# Patient Record
Sex: Male | Born: 1985 | Race: Black or African American | Hispanic: No | Marital: Single | Smoking: Current every day smoker
Health system: Southern US, Community
[De-identification: ages and names within clinical notes are randomized; demographics above are authoritative.]

---
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
## 2007-08-01 ENCOUNTER — Emergency Department: Payer: Self-pay | Admitting: Emergency Medicine

## 2008-07-21 ENCOUNTER — Emergency Department (HOSPITAL_COMMUNITY): Admission: EM | Admit: 2008-07-21 | Discharge: 2008-07-21 | Payer: Self-pay | Admitting: Emergency Medicine

## 2008-10-24 ENCOUNTER — Emergency Department: Payer: Self-pay | Admitting: Internal Medicine

## 2009-09-07 ENCOUNTER — Emergency Department: Payer: Self-pay | Admitting: Emergency Medicine

## 2010-11-20 ENCOUNTER — Emergency Department: Payer: Self-pay | Admitting: Emergency Medicine

## 2010-12-30 ENCOUNTER — Emergency Department: Payer: Self-pay | Admitting: *Deleted

## 2011-09-17 ENCOUNTER — Emergency Department: Payer: Self-pay | Admitting: Unknown Physician Specialty

## 2014-02-27 ENCOUNTER — Emergency Department (HOSPITAL_COMMUNITY)
Admission: EM | Admit: 2014-02-27 | Discharge: 2014-02-27 | Disposition: A | Discharge status: Home / Self Care | Payer: Self-pay | Attending: Emergency Medicine | Admitting: Emergency Medicine

## 2014-02-27 ENCOUNTER — Encounter (HOSPITAL_COMMUNITY): Payer: Self-pay | Admitting: Emergency Medicine

## 2014-02-27 DIAGNOSIS — R509 Fever, unspecified: Secondary | ICD-10-CM | POA: Insufficient documentation

## 2014-02-27 DIAGNOSIS — F17200 Nicotine dependence, unspecified, uncomplicated: Secondary | ICD-10-CM | POA: Insufficient documentation

## 2014-02-27 LAB — BASIC METABOLIC PANEL
Anion gap: 13 (ref 5–15)
BUN: 16 mg/dL (ref 6–23)
CO2: 25 mEq/L (ref 19–32)
Calcium: 9.2 mg/dL (ref 8.4–10.5)
Chloride: 99 mEq/L (ref 96–112)
Creatinine, Ser: 0.93 mg/dL (ref 0.50–1.35)
GFR calc Af Amer: >90 mL/min (ref >90)
GFR calc non Af Amer: >90 mL/min (ref >90)
Glucose, Bld: 111 mg/dL — ABNORMAL HIGH (ref 70–99)
Potassium: 3.8 mEq/L (ref 3.7–5.3)
Sodium: 137 mEq/L (ref 137–147)

## 2014-02-27 LAB — CBC WITH DIFFERENTIAL/PLATELET
Basophils Absolute: 0 10*3/uL (ref 0.0–0.1)
Basophils Relative: 0 % (ref 0–1)
Eosinophils Absolute: 0 10*3/uL (ref 0.0–0.7)
Eosinophils Relative: 1 % (ref 0–5)
HCT: 43.3 % (ref 39.0–52.0)
Hemoglobin: 14.2 g/dL (ref 13.0–17.0)
Lymphocytes Relative: 8 % — ABNORMAL LOW (ref 12–46)
Lymphs Abs: 0.3 10*3/uL — ABNORMAL LOW (ref 0.7–4.0)
MCH: 24.7 pg — ABNORMAL LOW (ref 26.0–34.0)
MCHC: 32.8 g/dL (ref 30.0–36.0)
MCV: 75.3 fL — ABNORMAL LOW (ref 78.0–100.0)
Monocytes Absolute: 0.2 10*3/uL (ref 0.1–1.0)
Monocytes Relative: 6 % (ref 3–12)
Neutro Abs: 3.1 10*3/uL (ref 1.7–7.7)
Neutrophils Relative %: 85 % — ABNORMAL HIGH (ref 43–77)
Platelets: 145 10*3/uL — ABNORMAL LOW (ref 150–400)
RBC: 5.75 MIL/uL (ref 4.22–5.81)
RDW: 13.8 % (ref 11.5–15.5)
WBC: 3.6 10*3/uL — ABNORMAL LOW (ref 4.0–10.5)

## 2014-02-27 LAB — I-STAT CG4 LACTIC ACID, ED: Lactic Acid, Venous: 0.95 mmol/L (ref 0.5–2.2)

## 2014-02-27 MED ORDER — IBUPROFEN 800 MG PO TABS
800.0000 mg | ORAL_TABLET | Freq: Once | ORAL | Status: AC
  Administered 2014-02-27: 800 mg via ORAL
  Filled 2014-02-27: qty 1

## 2014-02-27 MED ORDER — ACETAMINOPHEN 325 MG PO TABS
650.0000 mg | ORAL_TABLET | Freq: Four times a day (QID) | ORAL | Status: DC | PRN
  Administered 2014-02-27: 650 mg via ORAL
  Filled 2014-02-27: qty 2

## 2014-02-27 NOTE — ED Provider Notes (Signed)
CSN: [id number]     Arrival date & time 02/27/14  0138 History   First MD Initiated Contact with Patient 02/27/14 0308     Chief Complaint  Patient presents with  . Fever   HPI  History provided by the patient. Patient is a 28 year old male with no significant PMH presenting with symptoms of fever, headache and bodyaches. Symptoms first began yesterday afternoon and worsened through the evening. He reports some back aches, chills and headache. He did take some Tylenol early in the afternoon without much change. Denies any associated cough, congestion, sore throat, nausea, vomiting or diarrhea. No rash. No recent tick bites. No recent travel. No known sick contacts. No other aggravating or alleviating factors. No other associated symptoms.   History reviewed. No pertinent past medical history. History reviewed. No pertinent past surgical history. History reviewed. No pertinent family history. History  Substance Use Topics  . Smoking status: Current Every Day Smoker    Types: Cigarettes  . Smokeless tobacco: Not on file  . Alcohol Use: No    Review of Systems  Constitutional: Positive for fever and chills.  HENT: Negative for congestion, rhinorrhea and sore throat.   Respiratory: Negative for cough.   Cardiovascular: Negative for chest pain.  Gastrointestinal: Negative for nausea, vomiting, abdominal pain and diarrhea.  Genitourinary: Negative for dysuria, frequency, hematuria and discharge.  Skin: Negative for rash.  All other systems reviewed and are negative.     Allergies  Morphine and related  Home Medications   Prior to Admission medications   Not on File   BP 126/59  Pulse 76  Temp(Src) 101.7 F (38.7 C) (Oral)  Resp 23  Ht 5\' 11"  (1.803 m)  Wt 146 lb (66.225 kg)  BMI 20.37 kg/m2  SpO2 98% Physical Exam  Nursing note and vitals reviewed. Constitutional: He is oriented to person, place, and time. He appears well-developed and well-nourished. No distress.   HENT:  Head: Normocephalic.  Right Ear: Tympanic membrane normal.  Left Ear: Tympanic membrane normal.  Mouth/Throat: Oropharynx is clear and moist.  Eyes: Conjunctivae and EOM are normal. Pupils are equal, round, and reactive to light.  Neck: Normal range of motion. Neck supple.  No meningeal signs  Cardiovascular: Normal rate and regular rhythm.   No murmur heard. Pulmonary/Chest: Effort normal and breath sounds normal. No respiratory distress. He has no wheezes. He has no rales.  Abdominal: Soft. He exhibits no distension. There is no tenderness. There is no rebound and no guarding.  Lymphadenopathy:    He has no cervical adenopathy.  Neurological: He is alert and oriented to person, place, and time.  Skin: Skin is warm. No rash noted.  Psychiatric: He has a normal mood and affect. His behavior is normal.    ED Course  Procedures   COORDINATION OF CARE:  Nursing notes reviewed. Vital signs reviewed. Initial pt interview and examination performed.   Filed Vitals:   02/27/14 0230 02/27/14 0245 02/27/14 0300 02/27/14 0315  BP: 119/68 120/54 123/64 126/59  Pulse: 86 84 97 76  Temp:      TempSrc:      Resp: 14 25 24 23   Height:      Weight:      SpO2: 96% 96% 93% 98%    3:46 AM-patient seen and evaluated. Patient well appearing appropriate for age. Does not appear severely ill or toxic. No meningeal signs. Patient with slight leukopenia and thrombocytopenia. Discussed options for HIV test. Patient does not wish to have  any additional bloods drawn. Does not believe he has HIV. States he was checked within the last year after being released from prison. No prior history of STDs. No other concerning symptoms or findings. He generally appears well. At this time he may be discharged with symptomatic treatment for fever. He agrees. Strict return precautions given.   Treatment plan initiated: Medications  acetaminophen (TYLENOL) tablet 650 mg (650 mg Oral Given 02/27/14 0157)   ibuprofen (ADVIL,MOTRIN) tablet 800 mg (not administered)    Results for orders placed during the hospital encounter of 02/27/14  CBC WITH DIFFERENTIAL      Result Value Ref Range   WBC 3.6 (*) 4.0 - 10.5 K/uL   RBC 5.75  4.22 - 5.81 MIL/uL   Hemoglobin 14.2  13.0 - 17.0 g/dL   HCT 16.143.3  09.639.0 - 04.552.0 %   MCV 75.3 (*) 78.0 - 100.0 fL   MCH 24.7 (*) 26.0 - 34.0 pg   MCHC 32.8  30.0 - 36.0 g/dL   RDW 40.913.8  81.111.5 - 91.415.5 %   Platelets 145 (*) 150 - 400 K/uL   Neutrophils Relative % 85 (*) 43 - 77 %   Neutro Abs 3.1  1.7 - 7.7 K/uL   Lymphocytes Relative 8 (*) 12 - 46 %   Lymphs Abs 0.3 (*) 0.7 - 4.0 K/uL   Monocytes Relative 6  3 - 12 %   Monocytes Absolute 0.2  0.1 - 1.0 K/uL   Eosinophils Relative 1  0 - 5 %   Eosinophils Absolute 0.0  0.0 - 0.7 K/uL   Basophils Relative 0  0 - 1 %   Basophils Absolute 0.0  0.0 - 0.1 K/uL  BASIC METABOLIC PANEL      Result Value Ref Range   Sodium 137  137 - 147 mEq/L   Potassium 3.8  3.7 - 5.3 mEq/L   Chloride 99  96 - 112 mEq/L   CO2 25  19 - 32 mEq/L   Glucose, Bld 111 (*) 70 - 99 mg/dL   BUN 16  6 - 23 mg/dL   Creatinine, Ser 7.820.93  0.50 - 1.35 mg/dL   Calcium 9.2  8.4 - 95.610.5 mg/dL   GFR calc non Af Amer >90  >90 mL/min   GFR calc Af Amer >90  >90 mL/min   Anion gap 13  5 - 15  I-STAT CG4 LACTIC ACID, ED      Result Value Ref Range   Lactic Acid, Venous 0.95  0.5 - 2.2 mmol/L      MDM   Final diagnoses:  Fever, unspecified fever cause        Angus Sellereter S Aizlyn Schifano, PA-C 02/27/14 0403

## 2014-02-27 NOTE — ED Notes (Signed)
CG-4 result reported to Dr. Otter 

## 2014-02-27 NOTE — ED Provider Notes (Signed)
Medical screening examination/treatment/procedure(s) were performed by non-physician practitioner and as supervising physician I was immediately available for consultation/collaboration.   EKG Interpretation None       Taelor Waymire M Babara [place], MD 02/27/14 0650 

## 2014-02-27 NOTE — Discharge Instructions (Signed)
You were seen and evaluated for your fever and headache. At this time your providers do not feel your symptoms are caused by any concerning or emergent condition. Please followup with a primary care provider for continued evaluation and treatment. Return for any changing or worsening symptoms. Take Tylenol and ibuprofen for fever and body aches.    Fever, Adult A fever is a higher than normal body temperature. In an adult, an oral temperature around 98.6 F (37 C) is considered normal. A temperature of 100.4 F (38 C) or higher is generally considered a fever. Mild or moderate fevers generally have no long-term effects and often do not require treatment. Extreme fever (greater than or equal to 106 F or 41.1 C) can cause seizures. The sweating that may occur with repeated or prolonged fever may cause dehydration. Elderly people can develop confusion during a fever. A measured temperature can vary with:  Age.  Time of day.  Method of measurement (mouth, underarm, rectal, or ear). The fever is confirmed by taking a temperature with a thermometer. Temperatures can be taken different ways. Some methods are accurate and some are not.  An oral temperature is used most commonly. Electronic thermometers are fast and accurate.  An ear temperature will only be accurate if the thermometer is positioned as recommended by the manufacturer.  A rectal temperature is accurate and done for those adults who have a condition where an oral temperature cannot be taken.  An underarm (axillary) temperature is not accurate and not recommended. Fever is a symptom, not a disease.  CAUSES   Infections commonly cause fever.  Some noninfectious causes for fever include:  Some arthritis conditions.  Some thyroid or adrenal gland conditions.  Some immune system conditions.  Some types of cancer.  A medicine reaction.  High doses of certain street drugs such as methamphetamine.  Dehydration.  Exposure  to high outside or room temperatures.  Occasionally, the source of a fever cannot be determined. This is sometimes called a "fever of unknown origin" (FUO).  Some situations may lead to a temporary rise in body temperature that may go away on its own. Examples are:  Childbirth.  Surgery.  Intense exercise. HOME CARE INSTRUCTIONS   Take appropriate medicines for fever. Follow dosing instructions carefully. If you use acetaminophen to reduce the fever, be careful to avoid taking other medicines that also contain acetaminophen. Do not take aspirin for a fever if you are younger than age 27. There is an association with Reye's syndrome. Reye's syndrome is a rare but potentially deadly disease.  If an infection is present and antibiotics have been prescribed, take them as directed. Finish them even if you start to feel better.  Rest as needed.  Maintain an adequate fluid intake. To prevent dehydration during an illness with prolonged or recurrent fever, you may need to drink extra fluid.Drink enough fluids to keep your urine clear or pale yellow.  Sponging or bathing with room temperature water may help reduce body temperature. Do not use ice water or alcohol sponge baths.  Dress comfortably, but do not over-bundle. SEEK MEDICAL CARE IF:   You are unable to keep fluids down.  You develop vomiting or diarrhea.  You are not feeling at least partly better after 3 days.  You develop new symptoms or problems. SEEK IMMEDIATE MEDICAL CARE IF:   You have shortness of breath or trouble breathing.  You develop excessive weakness.  You are dizzy or you faint.  You are extremely thirsty  or you are making little or no urine.  You develop new pain that was not there before (such as in the head, neck, chest, back, or abdomen).  You have persistant vomiting and diarrhea for more than 1 to 2 days.  You develop a stiff neck or your eyes become sensitive to light.  You develop a skin  rash.  You have a fever or persistent symptoms for more than 2 to 3 days.  You have a fever and your symptoms suddenly get worse. MAKE SURE YOU:   Understand these instructions.  Will watch your condition.  Will get help right away if you are not doing well or get worse. Document Released: 02/03/2001 Document Revised: 11/02/2011 Document Reviewed: 06/11/2011 [facility] Patient Information 2015 [employer], [place]. This information is not intended to replace advice given to you by your health care provider. Make sure you discuss any questions you have with your health care provider.

## 2014-02-27 NOTE — ED Notes (Signed)
Presents with fever of 101.6, began this afternoon associated with headache, chills and middle back pain. Denies light sensitivty, denies neck pain. Reports mild nausea. Denies cough and abdominal pain.

## 2016-08-16 ENCOUNTER — Emergency Department (HOSPITAL_COMMUNITY)
Admission: EM | Admit: 2016-08-16 | Discharge: 2016-08-16 | Disposition: A | Discharge status: Home / Self Care | Payer: Self-pay | Attending: Emergency Medicine | Admitting: Emergency Medicine

## 2016-08-16 ENCOUNTER — Encounter (HOSPITAL_COMMUNITY): Payer: Self-pay | Admitting: Emergency Medicine

## 2016-08-16 DIAGNOSIS — Z202 Contact with and (suspected) exposure to infections with a predominantly sexual mode of transmission: Secondary | ICD-10-CM | POA: Insufficient documentation

## 2016-08-16 DIAGNOSIS — F17210 Nicotine dependence, cigarettes, uncomplicated: Secondary | ICD-10-CM | POA: Insufficient documentation

## 2016-08-16 DIAGNOSIS — Z711 Person with feared health complaint in whom no diagnosis is made: Secondary | ICD-10-CM

## 2016-08-16 DIAGNOSIS — M25511 Pain in right shoulder: Secondary | ICD-10-CM | POA: Insufficient documentation

## 2016-08-16 LAB — HIV ANTIBODY (ROUTINE TESTING W REFLEX): HIV SCREEN 4TH GENERATION: NONREACTIVE

## 2016-08-16 LAB — RPR: RPR Ser Ql: NONREACTIVE

## 2016-08-16 NOTE — ED Triage Notes (Signed)
Pt sts right shoulder pain x 3 days and requests STD check

## 2016-08-16 NOTE — ED Notes (Signed)
Declined W/C at D/C and was escorted to lobby by RN. 

## 2016-08-16 NOTE — Discharge Instructions (Signed)
1. Medications: usual home medications 2. Treatment: rest, drink plenty of fluids, use a condom with every sexual encounter 3. Follow Up: Please followup with your primary doctor in 3 days for discussion of your diagnoses and further evaluation after today's visit; if you do not have a primary care doctor use the resource guide provided to find one; Please return to the ER for worsening symptoms, high fevers or persistent vomiting.  You have been tested for HIV, syphilis, chlamydia and gonorrhea.  These results will be available in approximately 3 days.  Please inform all sexual partners if you test positive for any of these diseases.   I recommend taking 600 mg ibuprofen every 6 hours as needed for relief of your right shoulder pain. He may also apply ice to the area for 15-20 minutes 3-4 times daily for additional relief. I also recommend using Eucerin emmollient which she can buy over-the-counter and applying it to your areas of dry skin daily for the next week. Please follow up with a primary care provider from the Resource Guide provided below in one week if your symptoms are not improved. Return to the emergency department if symptoms worsen or new onset of fever, redness, swelling, warmth, numbness, tingling, weakness, decreased range of motion.

## 2016-08-16 NOTE — ED Provider Notes (Signed)
[facility]-EMERGENCY DEPT Provider Note   CSN: [id number] Arrival date & time: 08/16/16  0807     History   Chief Complaint Chief Complaint  Patient presents with  . Shoulder Pain  . Exposure to STD    HPI Gene Rojas is a 30 y.o. male.  HPI   Patient is a 30 year old male with no pertinent medical history presents to the ED with complaint of right shoulder pain, onset 3 days. Patient reports a few days ago when he was wrestling with his cousin he felt like he dislocated his shoulder. He reports having pain to his right shoulder which was worse with movement of his arm. He reports his symptoms have significantly improved over the past couple of days and he has full range of motion of his right shoulder. Patient denies taking any medications for his symptoms. Denies swelling, redness, numbness, tingling, weakness. Denies any prior surgeries or injuries to his right shoulder.  Patient also is requesting STD testing. He reports currently being sexually active with one partner, reports using condoms intermittently. Patient reports he has had mild irritation to his pubic region but denies any rash. Denies fever, abdominal pain, nausea, vomiting, rectal pain, urinary symptoms, penile or testicular pain/swelling, penile discharge, rash.  History reviewed. No pertinent past medical history.  There are no active problems to display for this patient.   History reviewed. No pertinent surgical history.     Home Medications    Prior to Admission medications   Not on File    Family History History reviewed. No pertinent family history.  Social History Social History  Substance Use Topics  . Smoking status: Current Every Day Smoker    Types: Cigarettes  . Smokeless tobacco: Not on file  . Alcohol use No     Allergies   Morphine and related   Review of Systems Review of Systems  Musculoskeletal: Positive for arthralgias (right shoulder).  Skin:       Irritation at pubic  region  All other systems reviewed and are negative.    Physical Exam Updated Vital Signs BP 135/76 (BP Location: Right Arm)   Pulse 65   Temp 97.6 F (36.4 C) (Oral)   Resp 18   SpO2 100%   Physical Exam  Constitutional: He is oriented to person, place, and time. He appears well-developed and well-nourished.  HENT:  Head: Normocephalic and atraumatic.  Eyes: Conjunctivae and EOM are normal. Right eye exhibits no discharge. Left eye exhibits no discharge. No scleral icterus.  Neck: Normal range of motion. Neck supple.  Cardiovascular: Normal rate.   Pulmonary/Chest: Effort normal. No respiratory distress.  Abdominal: Soft. He exhibits no distension. Hernia confirmed negative in the right inguinal area and confirmed negative in the left inguinal area.  Genitourinary: Testes normal and penis normal. Right testis shows no mass, no swelling and no tenderness. Left testis shows no mass, no swelling and no tenderness. Circumcised. No hypospadias, penile erythema or penile tenderness. No discharge found.  Musculoskeletal: Normal range of motion.       Right shoulder: Normal. He exhibits normal range of motion, no tenderness, no bony tenderness, no swelling, no effusion, no crepitus, no deformity, no laceration, normal pulse and normal strength.       Right elbow: Normal.      Right wrist: Normal.       Right forearm: Normal.       Right hand: Normal.  Lymphadenopathy: No inguinal adenopathy noted on the right or left side.  Neurological: He is alert and oriented to person, place, and time.  Skin: Skin is warm and dry.  Small area of xerosis noted at base of penile shaft. No vesicles, pustules, bulla, erythema or drainage noted. No swelling.  Nursing note and vitals reviewed.    ED Treatments / Results  Labs (all labs ordered are listed, but only abnormal results are displayed) Labs Reviewed  RPR  HIV ANTIBODY (ROUTINE TESTING)  GC/CHLAMYDIA PROBE AMP ([place]) NOT AT [facility]     EKG  EKG Interpretation None       Radiology No results found.  Procedures Procedures (including critical care time)  Medications Ordered in ED Medications - No data to display   Initial Impression / Assessment and Plan / ED Course  I have reviewed the triage vital signs and the nursing notes.  Pertinent labs & imaging results that were available during my care of the patient were reviewed by me and considered in my medical decision making (see chart for details).  Clinical Course     Patient presents with right shoulder pain that started after wrestling with his cousin a few days ago. Reports significant improvement over the past couple days with full range of motion of right shoulder. VSS. On exam patient with full range of motion of right shoulder with 5 out of 5 strength. Right arm neurovascularly intact. No deformity or injury noted. Pain managed in ED. Pt advised to follow up with PCP if symptoms persist for possibility of missed fracture diagnosis. Patient given brace while in ED, conservative therapy recommended and discussed. Patient will be dc home & is agreeable with above plan.   Patient is afebrile without abdominal tenderness, abdominal pain or painful bowel movements to indicate prostatitis.  No tenderness to palpation of the testes or epididymis to suggest orchitis or epididymitis.  STD cultures obtained including HIV, syphilis, gonorrhea and chlamydia. Patient to be discharged with instructions to follow up with PCP. Discussed importance of using protection when sexually active. Pt understands that they have GC/Chlamydia cultures pending and that they will need to inform all sexual partners if results return positive.     Final Clinical Impressions(s) / ED Diagnoses   Final diagnoses:  Concern about STD in male without diagnosis    New Prescriptions New Prescriptions   No medications on file     Barrett Henleicole Elizabeth Mariadelaluz [place], [place]PA-C 08/16/16 [id number]    Linwood DibblesJon  Knapp, MD 08/16/16 [phone]

## 2016-08-18 LAB — GC/CHLAMYDIA PROBE AMP (~~LOC~~) NOT AT ARMC
Chlamydia: NEGATIVE
NEISSERIA GONORRHEA: NEGATIVE

## 2016-12-19 ENCOUNTER — Emergency Department (HOSPITAL_COMMUNITY): Payer: No Typology Code available for payment source

## 2016-12-19 ENCOUNTER — Emergency Department (HOSPITAL_COMMUNITY)
Admission: EM | Admit: 2016-12-19 | Discharge: 2016-12-19 | Disposition: A | Discharge status: Home / Self Care | Payer: No Typology Code available for payment source | Attending: Emergency Medicine | Admitting: Emergency Medicine

## 2016-12-19 ENCOUNTER — Encounter (HOSPITAL_COMMUNITY): Payer: Self-pay

## 2016-12-19 DIAGNOSIS — Y999 Unspecified external cause status: Secondary | ICD-10-CM | POA: Diagnosis not present

## 2016-12-19 DIAGNOSIS — F17210 Nicotine dependence, cigarettes, uncomplicated: Secondary | ICD-10-CM | POA: Insufficient documentation

## 2016-12-19 DIAGNOSIS — Y92410 Unspecified street and highway as the place of occurrence of the external cause: Secondary | ICD-10-CM | POA: Insufficient documentation

## 2016-12-19 DIAGNOSIS — S61511A Laceration without foreign body of right wrist, initial encounter: Secondary | ICD-10-CM | POA: Diagnosis not present

## 2016-12-19 DIAGNOSIS — S6991XA Unspecified injury of right wrist, hand and finger(s), initial encounter: Secondary | ICD-10-CM | POA: Diagnosis present

## 2016-12-19 DIAGNOSIS — M791 Myalgia: Secondary | ICD-10-CM | POA: Diagnosis not present

## 2016-12-19 DIAGNOSIS — Y939 Activity, unspecified: Secondary | ICD-10-CM | POA: Diagnosis not present

## 2016-12-19 DIAGNOSIS — R51 Headache: Secondary | ICD-10-CM | POA: Insufficient documentation

## 2016-12-19 DIAGNOSIS — Z79899 Other long term (current) drug therapy: Secondary | ICD-10-CM | POA: Insufficient documentation

## 2016-12-19 MED ORDER — ACETAMINOPHEN 325 MG PO TABS
650.0000 mg | ORAL_TABLET | Freq: Once | ORAL | Status: AC
  Administered 2016-12-19: 650 mg via ORAL
  Filled 2016-12-19: qty 2

## 2016-12-19 MED ORDER — METHOCARBAMOL 500 MG PO TABS: 500.0000 mg | ORAL_TABLET | Freq: Every evening | ORAL | 0 refills | Status: DC | PRN

## 2016-12-19 MED ORDER — NAPROXEN 500 MG PO TABS: 500.0000 mg | ORAL_TABLET | Freq: Two times a day (BID) | ORAL | 0 refills | Status: DC

## 2016-12-19 MED ORDER — LIDOCAINE-EPINEPHRINE-TETRACAINE (LET) SOLUTION
3.0000 mL | Freq: Once | NASAL | Status: AC
  Administered 2016-12-19: 3 mL via TOPICAL
  Filled 2016-12-19: qty 3

## 2016-12-19 NOTE — Discharge Instructions (Signed)
As discussed, monitor for any signs of infection including redness, swelling, increased pain, or purulent discharge.  Do not drink alcohol, drive or operate heavy machinery while taking a muscle relaxer. Use naproxen twice a day with food for pain and swelling.  Follow-up with a primary care provider at the wellness clinic and to establish care. Return to the emergency department if you experience any worsening of symptoms including worsening headache, dizziness, nausea, vomiting or any other new concerning symptoms.

## 2016-12-19 NOTE — ED Notes (Signed)
Declined W/C at D/C and was escorted to lobby by RN. 
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
## 2017-06-17 ENCOUNTER — Emergency Department (HOSPITAL_COMMUNITY)
Admission: EM | Admit: 2017-06-17 | Discharge: 2017-06-17 | Disposition: A | Discharge status: Home / Self Care | Payer: Self-pay | Attending: Emergency Medicine | Admitting: Emergency Medicine

## 2017-06-17 ENCOUNTER — Encounter (HOSPITAL_COMMUNITY): Payer: Self-pay | Admitting: *Deleted

## 2017-06-17 DIAGNOSIS — R369 Urethral discharge, unspecified: Secondary | ICD-10-CM | POA: Insufficient documentation

## 2017-06-17 DIAGNOSIS — F17210 Nicotine dependence, cigarettes, uncomplicated: Secondary | ICD-10-CM | POA: Insufficient documentation

## 2017-06-17 DIAGNOSIS — Z79899 Other long term (current) drug therapy: Secondary | ICD-10-CM | POA: Insufficient documentation

## 2017-06-17 MED ORDER — LIDOCAINE HCL (PF) 1 % IJ SOLN
1.2000 mL | Freq: Once | INTRAMUSCULAR | Status: AC
  Administered 2017-06-17: 1.2 mL
  Filled 2017-06-17: qty 5

## 2017-06-17 MED ORDER — AZITHROMYCIN 250 MG PO TABS
1000.0000 mg | ORAL_TABLET | Freq: Once | ORAL | Status: AC
  Administered 2017-06-17: 1000 mg via ORAL
  Filled 2017-06-17: qty 4

## 2017-06-17 MED ORDER — CEFTRIAXONE SODIUM 250 MG IJ SOLR
250.0000 mg | Freq: Once | INTRAMUSCULAR | Status: AC
  Administered 2017-06-17: 250 mg via INTRAMUSCULAR
  Filled 2017-06-17: qty 250

## 2017-06-17 NOTE — ED Provider Notes (Signed)
[name] Provider Note   CSN: [id number] Arrival date & time: 06/17/17  [id number]  History   Chief Complaint Chief Complaint  Patient presents with  . Exposure to STD    HPI Gene CroftMarkus F Rosevear is a 31 y.o. male.  HPI   Pt to the ER for evaluation of penile discharge that started two days ago. He had sex recently and the condom broke. He has not been having any systemic symptoms of nausea, vomiting, diarrhea, fevers, chills, weakness, confusion, joint aches or pains.  History reviewed. No pertinent past medical history.  There are no active problems to display for this patient.   History reviewed. No pertinent surgical history.     Home Medications    Prior to Admission medications   Medication Sig Start Date End Date Taking? Authorizing Provider  methocarbamol (ROBAXIN) 500 MG tablet Take 1 tablet (500 mg total) by mouth at bedtime as needed for muscle spasms. 12/19/16   Mathews RobinsonsMitchell, Jessica B, PA-C  naproxen (NAPROSYN) 500 MG tablet Take 1 tablet (500 mg total) by mouth 2 (two) times daily with a meal. 12/19/16   Georgiana ShoreMitchell, Jessica B, PA-C    Family History No family history on file.  Social History Social History  Substance Use Topics  . Smoking status: Current Every Day Smoker    Types: Cigarettes  . Smokeless tobacco: Never Used  . Alcohol use No     Allergies   Morphine and related   Review of Systems Review of Systems Negative ROS aside from pertinent positives and negatives as listed in HPI   Physical Exam Updated Vital Signs BP 130/83 (BP Location: Right Arm)   Pulse 72   Temp 97.6 F (36.4 C) (Oral)   Resp 16   Ht 5\' 11"  (1.803 m)   Wt 70.3 kg (155 lb)   SpO2 100%   BMI 21.62 kg/m   Physical Exam  Constitutional: He appears well-developed and well-nourished. No distress.  HENT:  Head: Normocephalic and atraumatic.  Eyes: Pupils are equal, round, and reactive to light.  Neck: Normal range of motion. Neck  supple.  Cardiovascular: Normal rate and regular rhythm.   Pulmonary/Chest: Effort normal.  Abdominal: Soft.  Neurological: He is alert.  Skin: Skin is warm and dry.  Nursing note and vitals reviewed.    ED Treatments / Results  Labs (all labs ordered are listed, but only abnormal results are displayed) Labs Reviewed  GC/CHLAMYDIA PROBE AMP ([place] NOT AT [facility]    EKG  EKG Interpretation None       Radiology No results found.  Procedures Procedures (including critical care time)  Medications Ordered in ED Medications  cefTRIAXone (ROCEPHIN) injection 250 mg (250 mg Intramuscular Given 06/17/17 1147)  azithromycin (ZITHROMAX) tablet 1,000 mg (1,000 mg Oral Given 06/17/17 1148)  lidocaine (PF) (XYLOCAINE) 1 % injection 1.2 mL (1.2 mLs Other Given 06/17/17 1147)     Initial Impression / Assessment and Plan / ED Course  I have reviewed the triage vital signs and the nursing notes.  Pertinent labs & imaging results that were available during my care of the patient were reviewed by me and considered in my medical decision making (see chart for details).    Gc, rpr and HIV pending 1. Medications: usual home medications 2. Treatment: rest, drink plenty of fluids, use a condom with every sexual encounter 3. Follow Up: Please followup with your primary doctor in 3 days for discussion of your diagnoses and further  evaluation after today's visit; if you do not have a primary care doctor use the resource guide provided to find one; Please return to the ER for worsening symptoms, high fevers or persistent vomiting.  You have been tested for HIV, syphilis, chlamydia and gonorrhea.  These results will be available in approximately 3 days.  Please inform all sexual partners if you test positive for any of these diseases.   Final Clinical Impressions(s) / ED Diagnoses   Final diagnoses:  Penile discharge    New Prescriptions Discharge Medication List as of 06/17/2017  11:22 AM       Marlon Pel, PA-C 06/18/17 1657    Mancel Bale, MD 06/19/17 [phone]

## 2017-06-17 NOTE — ED Triage Notes (Signed)
To ED for eval of penile discharge for past couple of days.

## 2017-06-18 LAB — GC/CHLAMYDIA PROBE AMP (~~LOC~~) NOT AT ARMC
Chlamydia: NEGATIVE
Neisseria Gonorrhea: NEGATIVE

## 2019-07-02 ENCOUNTER — Other Ambulatory Visit: Payer: Self-pay

## 2019-07-02 ENCOUNTER — Emergency Department (HOSPITAL_BASED_OUTPATIENT_CLINIC_OR_DEPARTMENT_OTHER)
Admission: EM | Admit: 2019-07-02 | Discharge: 2019-07-02 | Disposition: A | Discharge status: Home / Self Care | Payer: Self-pay | Attending: Emergency Medicine | Admitting: Emergency Medicine

## 2019-07-02 DIAGNOSIS — Z5321 Procedure and treatment not carried out due to patient leaving prior to being seen by health care provider: Secondary | ICD-10-CM | POA: Insufficient documentation

## 2019-07-02 DIAGNOSIS — H5710 Ocular pain, unspecified eye: Secondary | ICD-10-CM | POA: Insufficient documentation

## 2019-07-02 NOTE — ED Notes (Addendum)
Pt states that he no longer feels comfortable being treated in ED after brief interaction with EDP. States EDP was rude to him. Pt states he overheard EDP "saying he couldn't hear me with my mask on." States "I wanna call to complain." Provided business card for ED director. Ambulated out of ED.

## 2019-07-02 NOTE — ED Notes (Signed)
Per registration, pt unhappy that he cannot have a visitor due to symptoms. Walked out of ED and is in parking lot. Registration to let RN know if patient returns to waiting room.

## 2019-07-02 NOTE — ED Triage Notes (Signed)
Reports loss of sense of taste & smell, cough, sore throat, generalized body aches, chills. Reports relief with OTC medications Advil, aleve. On cell phone in triage. Right eye puffy and irritated.

## 2019-07-02 NOTE — ED Notes (Signed)
While this tech was trying to obtain vitals Pt seemed irritated. Tech asked Pt to place a gown on then sit down on the bed. Pt was also asked to please place his phone on the bed so this tech could get is vitals which the Pt took a deep breathe. When this tech asked the Pt to pull his mask down please to get a temp the Pt kept saying "What?" this tech apologize that the mask and shield may be the barrier that caused the inability to hear each other. Pt stated that his blood pressure was probably high due to being annoyed prior to coming to room because this tech stated that she was going to repeat the blood pressure to see if it would go down.

## 2019-07-02 NOTE — ED Notes (Signed)
EDP at bedside  

## 2021-01-08 ENCOUNTER — Emergency Department (HOSPITAL_COMMUNITY)
Admission: EM | Admit: 2021-01-08 | Discharge: 2021-01-09 | Disposition: A | Discharge status: Home / Self Care | Payer: Self-pay | Attending: Emergency Medicine | Admitting: Emergency Medicine

## 2021-01-08 ENCOUNTER — Emergency Department (HOSPITAL_COMMUNITY): Payer: Self-pay

## 2021-01-08 ENCOUNTER — Other Ambulatory Visit: Payer: Self-pay

## 2021-01-08 DIAGNOSIS — M25511 Pain in right shoulder: Secondary | ICD-10-CM | POA: Insufficient documentation

## 2021-01-08 DIAGNOSIS — F17210 Nicotine dependence, cigarettes, uncomplicated: Secondary | ICD-10-CM | POA: Insufficient documentation

## 2021-01-08 NOTE — ED Notes (Addendum)
Pt back in lobby.

## 2021-01-08 NOTE — ED Triage Notes (Signed)
Right shoulder pain x 1 year. No recent trauma.

## 2021-01-09 ENCOUNTER — Emergency Department (HOSPITAL_COMMUNITY): Payer: Self-pay

## 2021-01-09 MED ORDER — NAPROXEN 500 MG PO TABS: 500.0000 mg | ORAL_TABLET | Freq: Two times a day (BID) | ORAL | 0 refills | Status: DC

## 2021-01-09 NOTE — ED Provider Notes (Signed)
[name] EMERGENCY DEPARTMENT Provider Note   CSN: [id number] Arrival date & time: 01/08/21  2256     History Chief Complaint  Patient presents with  . Shoulder Pain    Gene Rojas is a 35 y.o. male.  The history is provided by the patient and medical records.  Shoulder Pain   35 y.o. M here with 1 year of right shoulder pain/discomfort.  States he has noticed lately when looking in the mirror his right shoulder seems to "slump" lower than the left side.  He has increased pain when raising arm above head, trying to do pushups or pull ups, and rotating shoulder to the side.  He denies and numbness/weakness of right arm.  He is right hand dominant.  States he has never seen anyone about this before.  Denies new injury/trauma.  No repetitive movements.  No meds taken PTA.  No past medical history on file.  There are no problems to display for this patient.   No past surgical history on file.     No family history on file.  Social History   Tobacco Use  . Smoking status: Current Every Day Smoker    Types: Cigarettes  . Smokeless tobacco: Never Used  Substance Use Topics  . Alcohol use: No  . Drug use: No    Home Medications Prior to Admission medications   Medication Sig Start Date End Date Taking? Authorizing Provider  methocarbamol (ROBAXIN) 500 MG tablet Take 1 tablet (500 mg total) by mouth at bedtime as needed for muscle spasms. 12/19/16   Mathews Robinsons B, PA-C  naproxen (NAPROSYN) 500 MG tablet Take 1 tablet (500 mg total) by mouth 2 (two) times daily with a meal. 12/19/16   Mathews Robinsons B, PA-C    Allergies    Morphine and related  Review of Systems   Review of Systems  Musculoskeletal: Positive for arthralgias.  All other systems reviewed and are negative.   Physical Exam Updated Vital Signs BP 127/84 (BP Location: Left Arm)   Pulse 93   Temp 97.9 F (36.6 C) (Oral)   Resp 16   Ht 5\' 11"  (1.803 m)   Wt 72.6 kg    SpO2 99%   BMI 22.32 kg/m   Physical Exam Vitals and nursing note reviewed.  Constitutional:      Appearance: He is well-developed.  HENT:     Head: Normocephalic and atraumatic.  Eyes:     Conjunctiva/sclera: Conjunctivae normal.     Pupils: Pupils are equal, round, and reactive to light.  Cardiovascular:     Rate and Rhythm: Normal rate and regular rhythm.     Heart sounds: Normal heart sounds.  Pulmonary:     Effort: Pulmonary effort is normal.     Breath sounds: Normal breath sounds.  Abdominal:     General: Bowel sounds are normal.     Palpations: Abdomen is soft.  Musculoskeletal:        General: Normal range of motion.     Cervical back: Normal range of motion.     Comments: Shoulder do appear slightly asymmetric, questionable right shoulder drop, pain elicited with abduction and lifting arm above head, normal grip strength, normal sensation/perfusion  Skin:    General: Skin is warm and dry.  Neurological:     Mental Status: He is alert and oriented to person, place, and time.     ED Results / Procedures / Treatments   Labs (all labs ordered are listed,  but only abnormal results are displayed) Labs Reviewed - No data to display  EKG None  Radiology DG Shoulder Right  Result Date: 01/09/2021 CLINICAL DATA:  pain EXAM: RIGHT SHOULDER - 2+ VIEW COMPARISON:  None. FINDINGS: There is no evidence of fracture or dislocation. There is no evidence of arthropathy or other focal bone abnormality. Soft tissues are unremarkable. IMPRESSION: Negative. Electronically Signed   By: Maudry Mayhew MD   On: 01/09/2021 00:18    Procedures Procedures   Medications Ordered in ED Medications - No data to display  ED Course  I have reviewed the triage vital signs and the nursing notes.  Pertinent labs & imaging results that were available during my care of the patient were reviewed by me and considered in my medical decision making (see chart for details).    MDM  Rules/Calculators/A&P  35 year old male here with 1 year of right shoulder pain.  No prior evaluations for same.  States lately he noticed his right shoulder seems "lower" than the left.  He is right-hand dominant.  On exam when standing, shoulders do appear slightly asymmetric, questionable right shoulder drop.  Pain is elicited with abduction and full flexion above the head.  He has normal grip strength and sensation throughout the right arm.  X-rays negative.  Given nature of his symptoms and findings on exam, may have rotator cuff injury.  Will refer to orthopedics, may ultimately need MRI.  Will start anti-inflammatories to see if this helps with some of his discomfort.  May return here for any new or acute changes.  Final Clinical Impression(s) / ED Diagnoses Final diagnoses:  Acute pain of right shoulder    Rx / DC Orders ED Discharge Orders         Ordered    naproxen (NAPROSYN) 500 MG tablet  2 times daily with meals        01/09/21 0154           Garlon Hatchet, PA-C 01/09/21 0200    Palumbo, April, MD 01/09/21 5643

## 2021-01-09 NOTE — Discharge Instructions (Signed)
Take the prescribed medication as directed.   Follow-up with orthopedics-- I would call in the morning to get appointment scheduled. Return to the ED for new or worsening symptoms.

## 2023-05-19 IMAGING — CR DG SHOULDER 2+V*R*
4 series · 4 of 4 positions shown · non-contrast
Comparison: None.

CLINICAL DATA: pain

EXAM:
RIGHT SHOULDER - 2+ VIEW

[shoulder ap neutral]
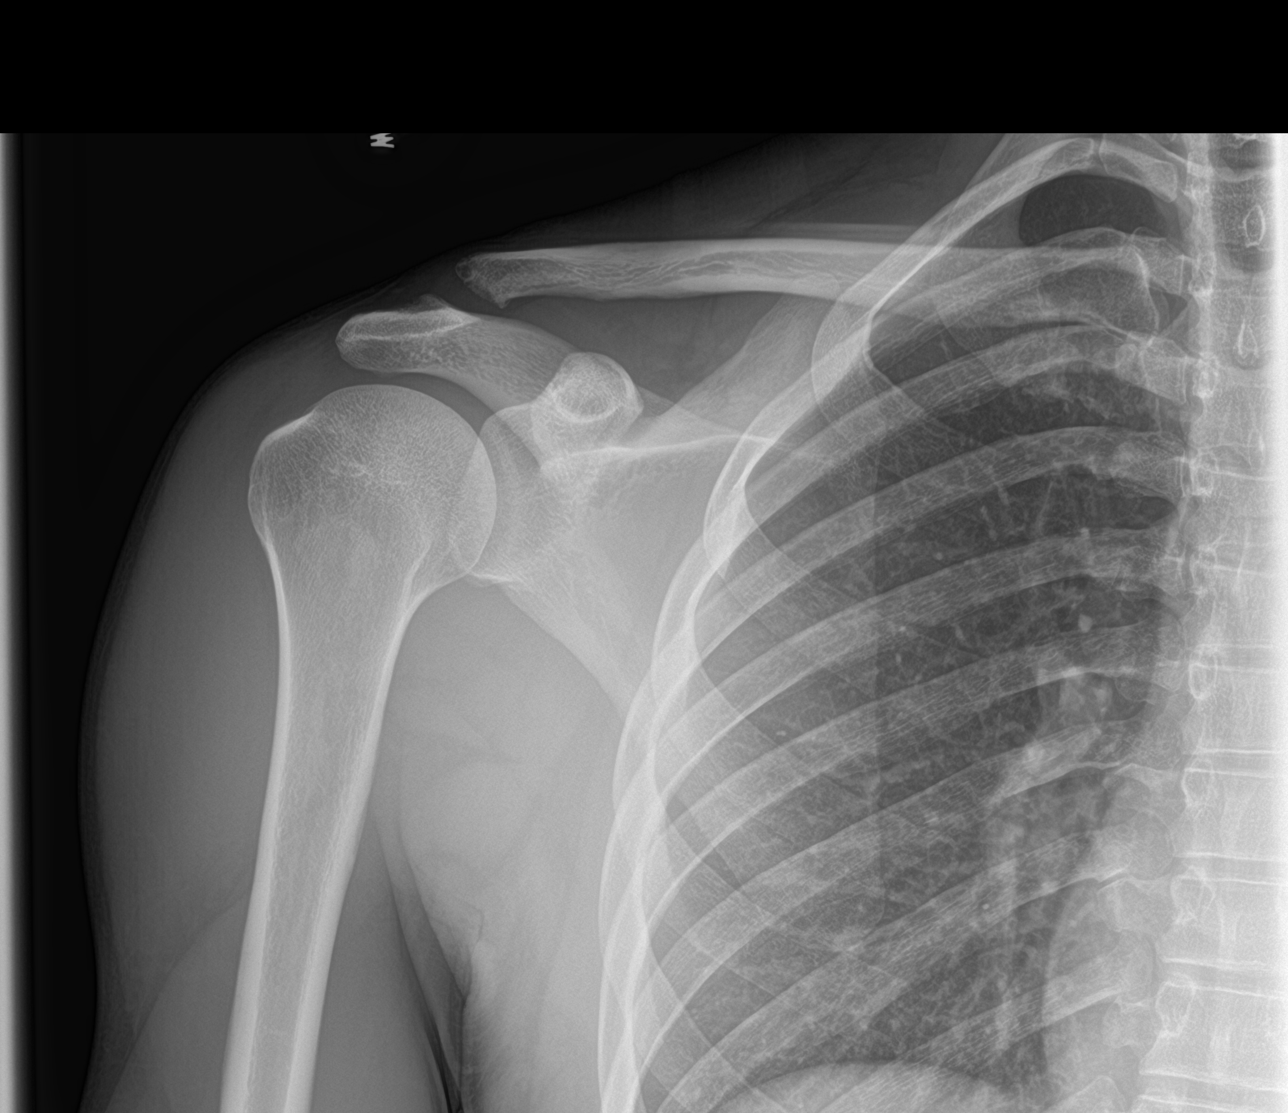

[shoulder axillary]
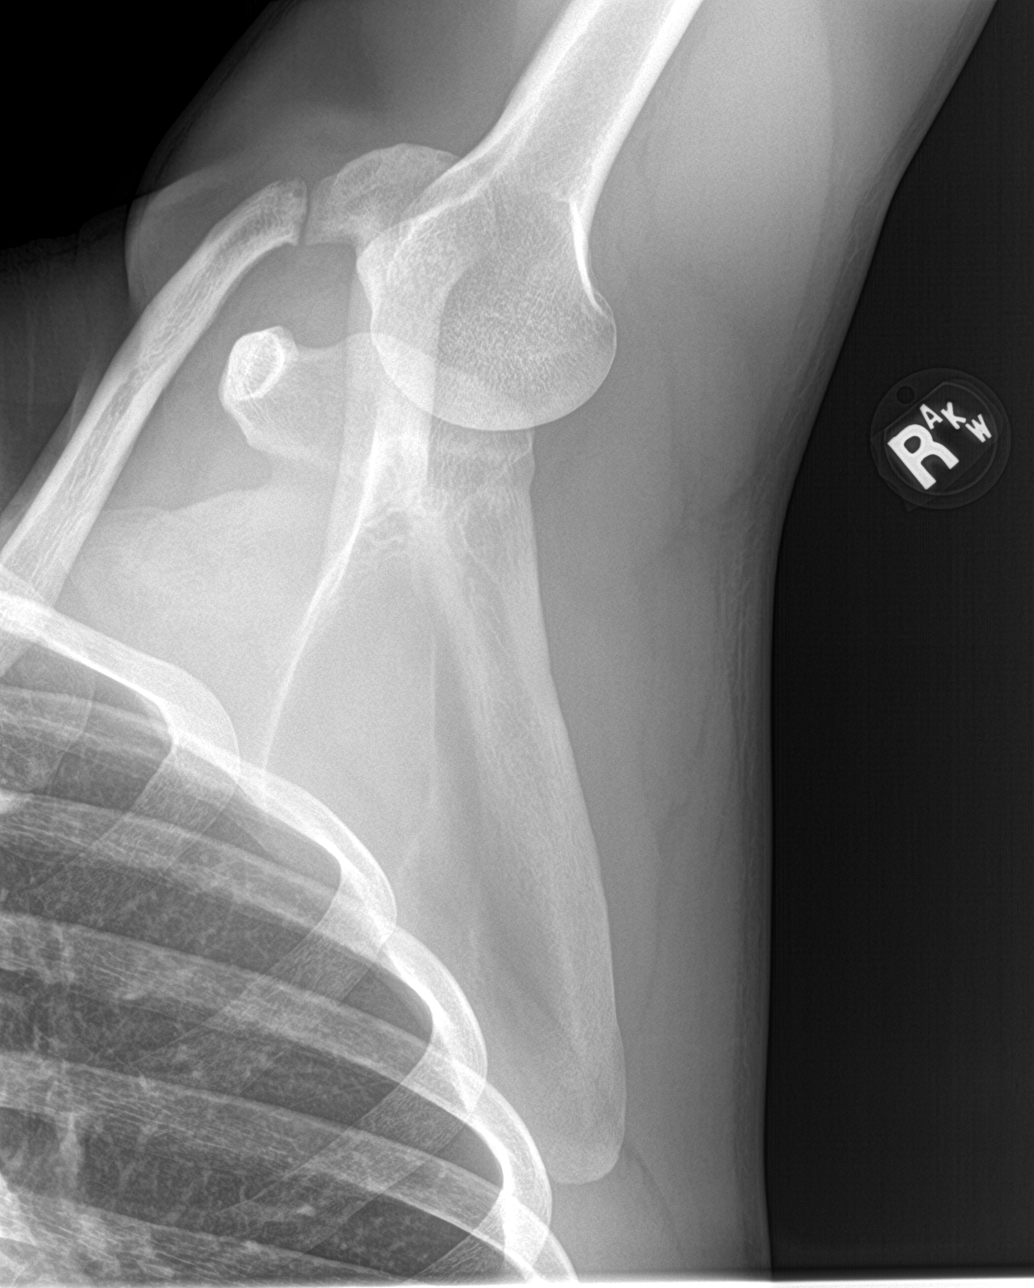

[shoulder y view]
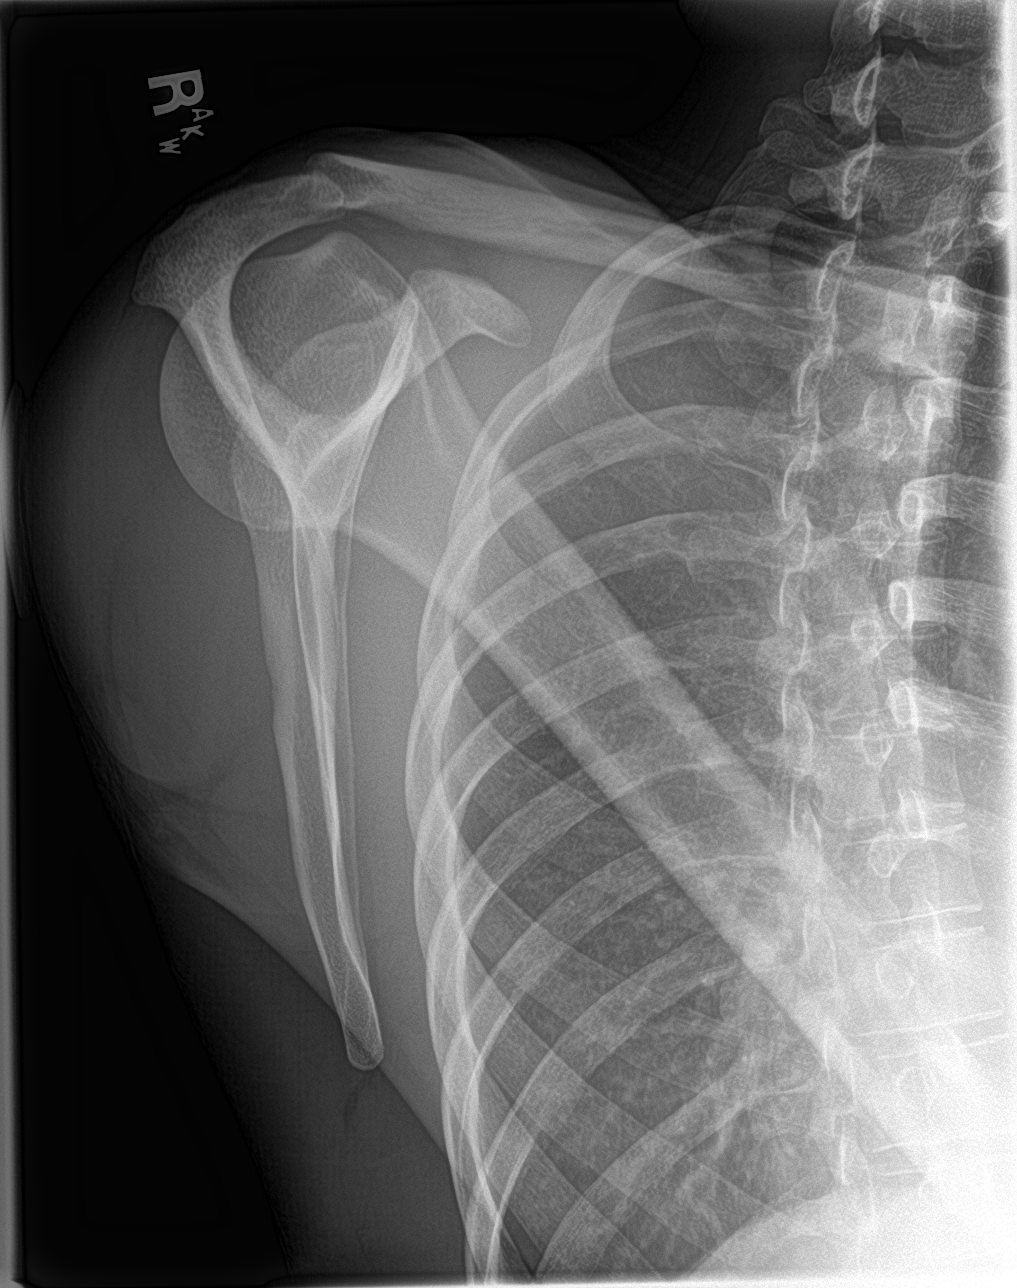

[shoulder grashey]
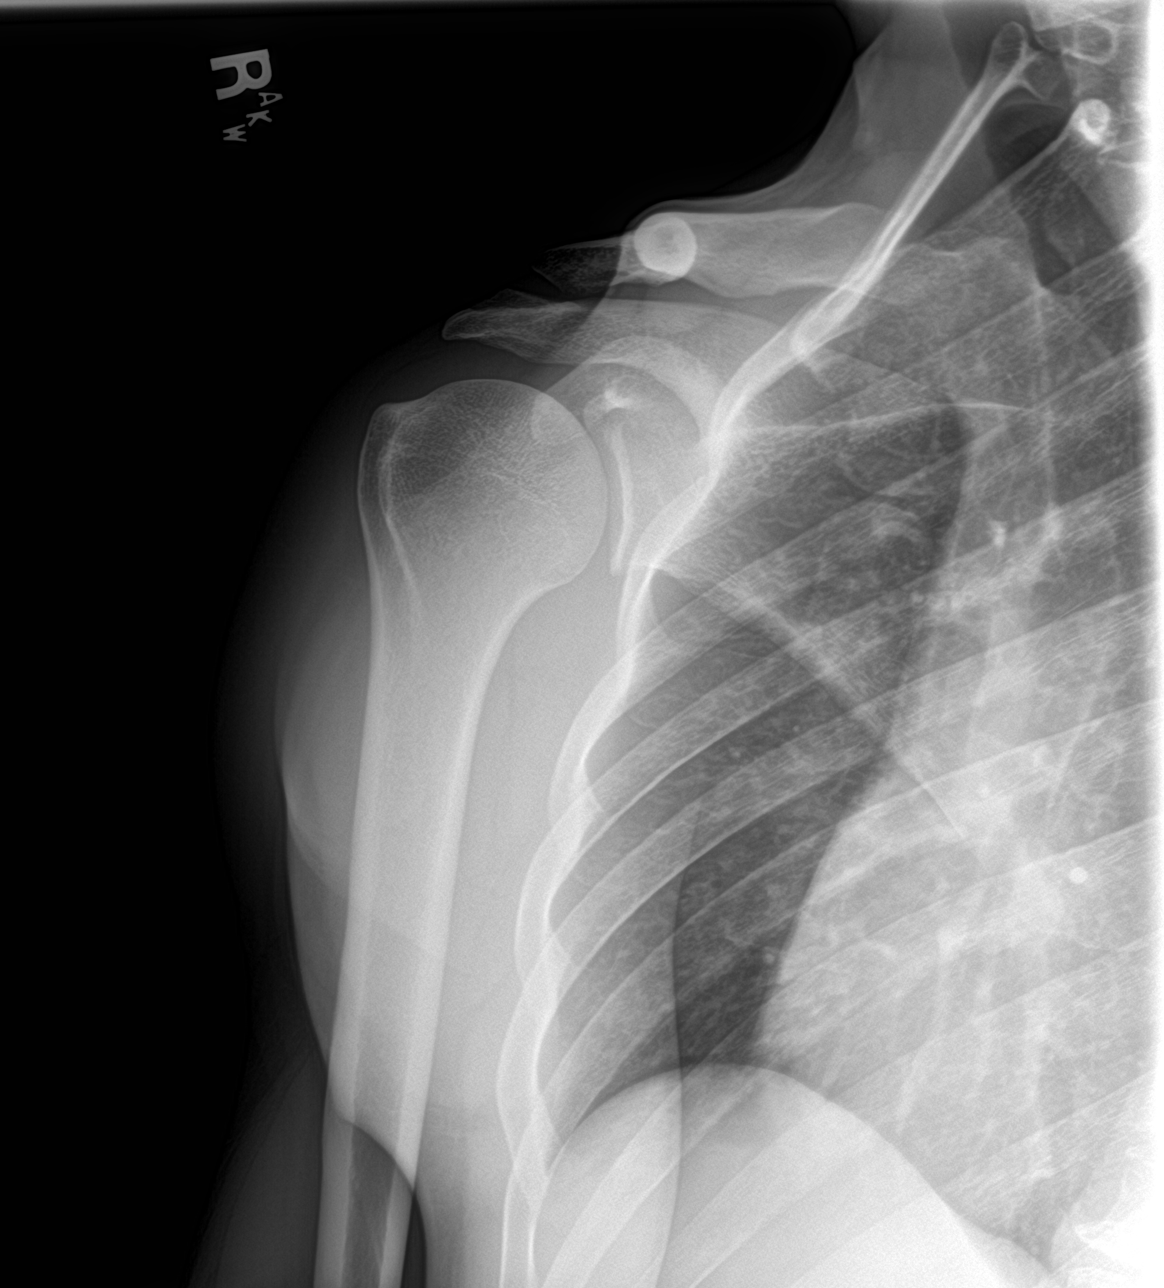

[4 of 4 positions shown; findings below may reference images not displayed]

FINDINGS: There is no evidence of fracture or dislocation. There is no
evidence of arthropathy or other focal bone abnormality. Soft
tissues are unremarkable.
IMPRESSION: Negative.

## 2024-03-01 ENCOUNTER — Ambulatory Visit
Admission: EM | Admit: 2024-03-01 | Discharge: 2024-03-01 | Disposition: A | Discharge status: Home / Self Care | Payer: Self-pay | Attending: Emergency Medicine | Admitting: Emergency Medicine

## 2024-03-01 ENCOUNTER — Encounter: Payer: Self-pay | Admitting: Emergency Medicine

## 2024-03-01 DIAGNOSIS — K130 Diseases of lips: Secondary | ICD-10-CM | POA: Insufficient documentation

## 2024-03-01 DIAGNOSIS — R220 Localized swelling, mass and lump, head: Secondary | ICD-10-CM | POA: Insufficient documentation

## 2024-03-01 MED ORDER — IBUPROFEN 800 MG PO TABS
800.0000 mg | ORAL_TABLET | Freq: Once | ORAL | Status: AC
  Administered 2024-03-01: 800 mg via ORAL

## 2024-03-01 NOTE — ED Triage Notes (Signed)
 Pt c/o lip swelling and sore on inside of lips since last night. Denies any difficulty breathing

## 2024-03-01 NOTE — Discharge Instructions (Signed)
 Your lip lesions appear to be canker sores.  You can do warm saline gargles after meals to help promote healing.  Avoid hot, spicy or foods as this can further irritate the sensitive skin.  Use Vaseline to help provide lubricant to the area.  100 mg of ibuprofen  every 8 hours can have both pain and swelling.  You can also ice the area.  Consider taking 25 mg of Benadryl to help with swelling, this may cause drowsiness or sedation.  Symptoms should improve over the next few days, if no improvement or any changes return to clinic for reevaluation.  The swab for cold sores/HSV should be back over the next few days and you will be contacted if results are abnormal.

## 2024-03-01 NOTE — ED Provider Notes (Signed)
 [facility]    CSN: [id number] Arrival date & time: 03/01/24  1114      History   Chief Complaint Chief Complaint  Patient presents with   Oral Swelling    HPI Gene Rojas is a 38 y.o. male.   Patient presents to clinic over concern of inner lip sores and lesions.  Noticed that yesterday his lips felt dry and irritated.  He had [employer] and then some pineapple prior to bed and waking up this morning he noticed some sores on his upper and lower lips.  Has never had cold sores, denies history of HSV or exposure.  Daughter did have hand-foot-and-mouth around 3 weeks or so ago.  Has not had any trouble swallowing, drooling or oral swelling other than the lower lip.  Denies any lesions on his hands or feet.  Denies fevers.  Has not taken any medications or tried any interventions for the lesions and lower lip swelling.  The history is provided by the patient and medical records.    History reviewed. No pertinent past medical history.  There are no active problems to display for this patient.   History reviewed. No pertinent surgical history.     Home Medications    Prior to Admission medications   Medication Sig Start Date End Date Taking? Authorizing Provider  methocarbamol  (ROBAXIN ) 500 MG tablet Take 1 tablet (500 mg total) by mouth at bedtime as needed for muscle spasms. 12/19/16   Merilee Raisin B, PA-C  naproxen  (NAPROSYN ) 500 MG tablet Take 1 tablet (500 mg total) by mouth 2 (two) times daily with a meal. 01/09/21   Jarold Olam HERO, PA-C    Family History History reviewed. No pertinent family history.  Social History Social History   Tobacco Use   Smoking status: Every Day    Types: Cigarettes   Smokeless tobacco: Never  Substance Use Topics   Alcohol use: No   Drug use: No     Allergies   Morphine and codeine   Review of Systems Review of Systems  Per HPI  Physical Exam Triage Vital Signs ED Triage Vitals [03/01/24  1130]  Encounter Vitals Group     BP 121/82     Girls Systolic BP Percentile      Girls Diastolic BP Percentile      Boys Systolic BP Percentile      Boys Diastolic BP Percentile      Pulse Rate 67     Resp 17     Temp 98.1 F (36.7 C)     Temp Source Oral     SpO2 98 %     Weight      Height      Head Circumference      Peak Flow      Pain Score      Pain Loc      Pain Education      Exclude from Growth Chart    No data found.  Updated Vital Signs BP 121/82 (BP Location: Right Arm)   Pulse 67   Temp 98.1 F (36.7 C) (Oral)   Resp 17   SpO2 98%   Visual Acuity Right Eye Distance:   Left Eye Distance:   Bilateral Distance:    Right Eye Near:   Left Eye Near:    Bilateral Near:     Physical Exam Vitals and nursing note reviewed.  Constitutional:      Appearance: Normal appearance.  HENT:  Head: Normocephalic and atraumatic.     Right Ear: External ear normal.     Left Ear: External ear normal.     Nose: Nose normal.     Mouth/Throat:     Lips: Lesions present.     Mouth: Mucous membranes are moist.     Comments: Upper and lower inner lip ulcerations, lower lip swelling. Without s/s of angioedema. No lesions to cheeks, palate or tonsils. No lesions to external lips.  Eyes:     Conjunctiva/sclera: Conjunctivae normal.  Cardiovascular:     Rate and Rhythm: Normal rate.  Pulmonary:     Effort: Pulmonary effort is normal. No respiratory distress.  Skin:    General: Skin is warm and dry.  Neurological:     General: No focal deficit present.     Mental Status: He is alert.  Psychiatric:        Mood and Affect: Mood normal.        Behavior: Behavior is cooperative.      UC Treatments / Results  Labs (all labs ordered are listed, but only abnormal results are displayed) Labs Reviewed  HSV 1/2 PCR (SURFACE)    EKG   Radiology No results found.  Procedures Procedures (including critical care time)  Medications Ordered in UC Medications   ibuprofen  (ADVIL ) tablet 800 mg (800 mg Oral Given 03/01/24 1156)    Initial Impression / Assessment and Plan / UC Course  I have reviewed the triage vital signs and the nursing notes.  Pertinent labs & imaging results that were available during my care of the patient were reviewed by me and considered in my medical decision making (see chart for details).  Vitals in triage reviewed, patient is hemodynamically stable.  Upper and lower inner lip lesions, consistent with canker sores.  Areas are not vesicular or external and without fever, no known history or exposure of HSV, HSV swab obtained to rule this out.  Suspect canker sores irritated by acidic food, had pineapple last night.  Symptomatic management discussed.  Ibuprofen  given in clinic for pain and swelling.  Low concern for angioedema, able to speak without difficulty, oxygenation 98% on room air and without oral swelling other than isolated lower lip.  Plan of care, follow-up care return precautions given, no questions at this time.     Final Clinical Impressions(s) / UC Diagnoses   Final diagnoses:  Lip lesion  Lip swelling     Discharge Instructions      Your lip lesions appear to be canker sores.  You can do warm saline gargles after meals to help promote healing.  Avoid hot, spicy or foods as this can further irritate the sensitive skin.  Use Vaseline to help provide lubricant to the area.  100 mg of ibuprofen  every 8 hours can have both pain and swelling.  You can also ice the area.  Consider taking 25 mg of Benadryl to help with swelling, this may cause drowsiness or sedation.  Symptoms should improve over the next few days, if no improvement or any changes return to clinic for reevaluation.  The swab for cold sores/HSV should be back over the next few days and you will be contacted if results are abnormal.     ED Prescriptions   None    PDMP not reviewed this encounter.   Dreama Glean SAILOR, FNP 03/01/24  1158

## 2024-03-02 LAB — HSV 1/2 PCR (SURFACE)
HSV-1 DNA: NOT DETECTED
HSV-2 DNA: NOT DETECTED

## 2024-03-03 ENCOUNTER — Other Ambulatory Visit: Payer: Self-pay

## 2024-03-03 DIAGNOSIS — B084 Enteroviral vesicular stomatitis with exanthem: Secondary | ICD-10-CM | POA: Insufficient documentation

## 2024-03-03 NOTE — ED Triage Notes (Signed)
 Pt reports concern for lip swelling that has been going on for the past 3-4 days. Sts concern as swelling has worsened and radiating into the jaws. Denies fevers/chills or difficulty swallowing. No throat swelling / resp distress. Voice WDL. Airway intact. No Ace inhibitors. Was seen at urgent care on 7/9.

## 2024-03-04 ENCOUNTER — Emergency Department (HOSPITAL_BASED_OUTPATIENT_CLINIC_OR_DEPARTMENT_OTHER)
Admission: EM | Admit: 2024-03-04 | Discharge: 2024-03-04 | Disposition: A | Discharge status: Home / Self Care | Payer: Self-pay | Attending: Emergency Medicine | Admitting: Emergency Medicine

## 2024-03-04 DIAGNOSIS — B084 Enteroviral vesicular stomatitis with exanthem: Secondary | ICD-10-CM

## 2024-03-04 LAB — COMPREHENSIVE METABOLIC PANEL WITH GFR
ALT: 9 U/L (ref 0–44)
AST: 15 U/L (ref 15–41)
Albumin: 4.3 g/dL (ref 3.5–5.0)
Alkaline Phosphatase: 84 U/L (ref 38–126)
Anion gap: 12 (ref 5–15)
BUN: 9 mg/dL (ref 6–20)
CO2: 25 mmol/L (ref 22–32)
Calcium: 8.9 mg/dL (ref 8.9–10.3)
Chloride: 101 mmol/L (ref 98–111)
Creatinine, Ser: 0.9 mg/dL (ref 0.61–1.24)
GFR, Estimated: >60 mL/min (ref >60)
Glucose, Bld: 86 mg/dL (ref 70–99)
Potassium: 4.1 mmol/L (ref 3.5–5.1)
Sodium: 138 mmol/L (ref 135–145)
Total Bilirubin: 0.7 mg/dL (ref 0.0–1.2)
Total Protein: 7.1 g/dL (ref 6.5–8.1)

## 2024-03-04 LAB — CBC WITH DIFFERENTIAL/PLATELET
Abs Immature Granulocytes: 0.06 K/uL (ref 0.00–0.07)
Basophils Absolute: 0 K/uL (ref 0.0–0.1)
Basophils Relative: 1 %
Eosinophils Absolute: 0.1 K/uL (ref 0.0–0.5)
Eosinophils Relative: 1 %
HCT: 45.7 % (ref 39.0–52.0)
Hemoglobin: 14.6 g/dL (ref 13.0–17.0)
Immature Granulocytes: 1 %
Lymphocytes Relative: 20 %
Lymphs Abs: 1.2 K/uL (ref 0.7–4.0)
MCH: 24 pg — ABNORMAL LOW (ref 26.0–34.0)
MCHC: 31.9 g/dL (ref 30.0–36.0)
MCV: 75.2 fL — ABNORMAL LOW (ref 80.0–100.0)
Monocytes Absolute: 0.6 K/uL (ref 0.1–1.0)
Monocytes Relative: 10 %
Neutro Abs: 4.1 K/uL (ref 1.7–7.7)
Neutrophils Relative %: 67 %
Platelets: 171 K/uL (ref 150–400)
RBC: 6.08 MIL/uL — ABNORMAL HIGH (ref 4.22–5.81)
RDW: 14.9 % (ref 11.5–15.5)
WBC: 6.1 K/uL (ref 4.0–10.5)
nRBC: 0 % (ref 0.0–0.2)

## 2024-03-04 LAB — HIV ANTIBODY (ROUTINE TESTING W REFLEX): HIV Screen 4th Generation wRfx: NONREACTIVE

## 2024-03-04 MED ORDER — IBUPROFEN 800 MG PO TABS: 800.0000 mg | ORAL_TABLET | Freq: Three times a day (TID) | ORAL | 0 refills | Status: DC | PRN

## 2024-03-04 MED ORDER — LIDOCAINE VISCOUS HCL 2 % MT SOLN: 5.0000 mL | Freq: Four times a day (QID) | OROMUCOSAL | 0 refills | Status: DC | PRN

## 2024-03-04 MED ORDER — LIDOCAINE VISCOUS HCL 2 % MT SOLN
15.0000 mL | Freq: Once | OROMUCOSAL | Status: AC
  Administered 2024-03-04: 15 mL via OROMUCOSAL
  Filled 2024-03-04: qty 15

## 2024-03-04 NOTE — Discharge Instructions (Signed)
 I suspect that your mouth ulcers are caused by a virus.  This will need to run its course but I have called in some medicine to help with the sores in your mouth.  Please follow closely with your primary care doctor.

## 2024-03-04 NOTE — ED Provider Notes (Signed)
 Emergency Department Provider Note   I have reviewed the triage vital signs and the nursing notes.   HISTORY  Chief Complaint Facial Swelling   HPI Gene Rojas is a 38 y.o. male presents to the emergency department with mouth and lip swelling with some ulceration.  He denies any new medications.  No rash.  He has had some subjective fevers and fatigue with bodyaches.  He states one of his children recently had hand-foot-and-mouth.  He has had pain with swallowing.  Has tried Tylenol  with no lasting relief.   No past medical history on file.  Review of Systems  Constitutional: No fever/chills Cardiovascular: Denies chest pain. Respiratory: Denies shortness of breath. Gastrointestinal: No abdominal pain.  No nausea, no vomiting.  Skin: Negative for rash. Neurological: Negative for headaches.   ____________________________________________   PHYSICAL EXAM:  VITAL SIGNS: ED Triage Vitals  Encounter Vitals Group     BP 03/03/24 2345 135/84     Pulse Rate 03/03/24 2345 79     Resp 03/03/24 2345 14     Temp 03/03/24 2345 98.2 F (36.8 C)     Temp Source 03/03/24 2345 Oral     SpO2 03/03/24 2345 100 %     Weight 03/03/24 2343 160 lb (72.6 kg)     Height 03/03/24 2343 5' 11 (1.803 m)   Constitutional: Alert and oriented. Well appearing and in no acute distress. Eyes: Conjunctivae are normal.  Head: Atraumatic. Nose: No congestion/rhinnorhea. Mouth/Throat: Mucous membranes are moist.  Oropharynx non-erythematous.  Upper and lower lips are swollen but no severe angioedema.  His oral mucosa shows ulceration and diffuse patches including the lips, tonsils, under the tongue.  Posterior pharynx is widely patent.  Neck: No stridor.   Cardiovascular: Normal rate, regular rhythm. Good peripheral circulation. Grossly normal heart sounds.   Respiratory: Normal respiratory effort.  No retractions. Lungs CTAB. Gastrointestinal: No distention.  Musculoskeletal:  No gross  deformities of extremities. Neurologic:  Normal speech and language.  Skin:  Skin is warm, dry and intact. No rash noted.  ____________________________________________   LABS (all labs ordered are listed, but only abnormal results are displayed)  Labs Reviewed  CBC WITH DIFFERENTIAL/PLATELET - Abnormal; Notable for the following components:      Result Value   RBC 6.08 (*)    MCV 75.2 (*)    MCH 24.0 (*)    All other components within normal limits  COMPREHENSIVE METABOLIC PANEL WITH GFR  HIV ANTIBODY (ROUTINE TESTING W REFLEX)   ____________________________________________   PROCEDURES  Procedure(s) performed:   Procedures  None  ____________________________________________   INITIAL IMPRESSION / ASSESSMENT AND PLAN / ED COURSE  Pertinent labs & imaging results that were available during my care of the patient were reviewed by me and considered in my medical decision making (see chart for details).   This patient is Presenting for Evaluation of mouth sores, which does require a range of treatment options, and is a complaint that involves a high risk of morbidity and mortality.  The Differential Diagnoses include hand-foot-and-mouth, other viral illness, aphthous ulcer, severe allergic reaction, etc.  Critical Interventions-    Medications  lidocaine  (XYLOCAINE ) 2 % viscous mouth solution 15 mL (15 mLs Mouth/Throat Given 03/04/24 0206)    Reassessment after intervention:  symptoms improved.   Clinical Laboratory Tests Ordered, included CBC without leukocytosis or anemia.  No acute kidney injury.  LFTs normal.  Medical Decision Making: Summary:  Patient presents the emergency department with sores in  the mouth and some mild lip swelling.  Nothing to indicate impending airway compromise.  Given his history of a child at home recently with hand-foot-and-mouth my immediate suspicion is that this is likely viral in origin.  No other rash.  No new medications.  This does  not seem consistent with an acute allergic reaction.  Plan for supportive care mainly.  We discussed p.o. hydration and called in a prescription for Magic mouthwash to the pharmacy.   Patient's presentation is most consistent with acute, uncomplicated illness.   Disposition: discharge  ____________________________________________  FINAL CLINICAL IMPRESSION(S) / ED DIAGNOSES  Final diagnoses:  Hand, foot and mouth disease     NEW OUTPATIENT MEDICATIONS STARTED DURING THIS VISIT:  Discharge Medication List as of 03/04/2024  2:16 AM     START taking these medications   Details  ibuprofen  (ADVIL ) 800 MG tablet Take 1 tablet (800 mg total) by mouth every 8 (eight) hours as needed for mild pain (pain score 1-3) or moderate pain (pain score 4-6)., Starting Sat 03/04/2024, Normal    magic mouthwash (lidocaine , diphenhydrAMINE, alum & mag hydroxide) suspension Swish and spit 5 mLs 4 (four) times daily as needed for mouth pain., Starting Sat 03/04/2024, Normal        Note:  This document was prepared using Dragon voice recognition software and may include unintentional dictation errors.  Fonda Law, MD, [facility]    Jurney Overacker, Fonda MATSU, MD 03/04/24 [phone]

## 2024-03-14 ENCOUNTER — Emergency Department (HOSPITAL_BASED_OUTPATIENT_CLINIC_OR_DEPARTMENT_OTHER)
Admission: EM | Admit: 2024-03-14 | Discharge: 2024-03-14 | Disposition: A | Discharge status: Home / Self Care | Payer: Self-pay | Attending: Emergency Medicine | Admitting: Emergency Medicine

## 2024-03-14 ENCOUNTER — Encounter (HOSPITAL_BASED_OUTPATIENT_CLINIC_OR_DEPARTMENT_OTHER): Payer: Self-pay | Admitting: Emergency Medicine

## 2024-03-14 ENCOUNTER — Other Ambulatory Visit: Payer: Self-pay

## 2024-03-14 DIAGNOSIS — N342 Other urethritis: Secondary | ICD-10-CM | POA: Insufficient documentation

## 2024-03-14 MED ORDER — CEFTRIAXONE SODIUM 500 MG IJ SOLR
500.0000 mg | Freq: Once | INTRAMUSCULAR | Status: AC
  Administered 2024-03-14: 500 mg via INTRAMUSCULAR
  Filled 2024-03-14: qty 500

## 2024-03-14 MED ORDER — DOXYCYCLINE HYCLATE 100 MG PO CAPS: 100.0000 mg | ORAL_CAPSULE | Freq: Two times a day (BID) | ORAL | 0 refills | Status: AC

## 2024-03-14 MED ORDER — STERILE WATER FOR INJECTION IJ SOLN
INTRAMUSCULAR | Status: AC
  Filled 2024-03-14: qty 10

## 2024-03-14 NOTE — ED Triage Notes (Signed)
 Pt new partner, noticed penal discharge about 3 days ago.

## 2024-03-14 NOTE — ED Provider Notes (Signed)
  [place] [facility] Provider Note   CSN: [id number] Arrival date & time: 03/14/24  0129     Patient presents with: Penile Discharge   Gene Rojas is a 38 y.o. male.   The history is provided by the patient.  Penile Discharge This is a new problem. The current episode started more than 2 days ago. Nothing relieves the symptoms.  Patient reports penile discharge about 3 days, reports he has a new sexual partner.  No fevers or vomiting.  No penile lesions     Prior to Admission medications   Medication Sig Start Date End Date Taking? Authorizing Provider  doxycycline  (VIBRAMYCIN ) 100 MG capsule Take 1 capsule (100 mg total) by mouth 2 (two) times daily. One po bid x 7 days 03/14/24  Yes Midge Golas, MD    Allergies: Morphine and codeine    Review of Systems  Genitourinary:  Positive for penile discharge.    Updated Vital Signs BP (!) 145/94 (BP Location: Right Arm)   Pulse 83   Temp 98 F (36.7 C) (Oral)   Resp 16   Ht 1.803 m (5' 11)   Wt 72.5 kg   SpO2 99%   BMI 22.29 kg/m   Physical Exam CONSTITUTIONAL: Well developed/well nourished HEAD: Normocephalic/atraumatic GU: Nurse Ronal present for exam Small amount of discharge noted at tip of penis.  No penile lesions.  No scrotal edema or erythema NEURO: Pt is awake/alert/appropriate, moves all extremitiesx4.  No facial droop.    (all labs ordered are listed, but only abnormal results are displayed) Labs Reviewed  GC/CHLAMYDIA PROBE AMP ([place]) NOT AT [facility]    EKG: None  Radiology: No results found.   Procedures   Medications Ordered in the ED  cefTRIAXone  (ROCEPHIN ) injection 500 mg (has no administration in time range)                                    Medical Decision Making Risk Prescription drug management.   Patient presents with a green penis discharge about 3 days. At patient request, will empirically treat for GC/chlamydia We discussed  safe sex practices. Advised to follow-up w/ health department for all further STD concerns     Final diagnoses:  Urethritis    ED Discharge Orders          Ordered    doxycycline  (VIBRAMYCIN ) 100 MG capsule  2 times daily        03/14/24 0149               Midge Golas, MD 03/14/24 [phone]

## 2024-03-15 LAB — GC/CHLAMYDIA PROBE AMP (~~LOC~~) NOT AT ARMC
Chlamydia: NEGATIVE
Comment: NEGATIVE
Comment: NORMAL
Neisseria Gonorrhea: NEGATIVE
